# Patient Record
Sex: Male | Born: 1935 | Race: White | Hispanic: No | Smoking: Never smoker
Health system: Southern US, Community
[De-identification: ages and names within clinical notes are randomized; demographics above are authoritative.]

## PROBLEM LIST (undated history)

## (undated) HISTORY — PX: TONSILLECTOMY: SUR1361

## (undated) HISTORY — PX: BACK SURGERY: SHX140

---

## 2013-12-21 ENCOUNTER — Encounter (HOSPITAL_BASED_OUTPATIENT_CLINIC_OR_DEPARTMENT_OTHER): Payer: Self-pay | Admitting: Emergency Medicine

## 2013-12-21 ENCOUNTER — Emergency Department (HOSPITAL_BASED_OUTPATIENT_CLINIC_OR_DEPARTMENT_OTHER)
Admission: EM | Admit: 2013-12-21 | Discharge: 2013-12-22 | Disposition: A | Discharge status: Home / Self Care | Payer: MEDICARE | Attending: Emergency Medicine | Admitting: Emergency Medicine

## 2013-12-21 DIAGNOSIS — Z9889 Other specified postprocedural states: Secondary | ICD-10-CM | POA: Diagnosis not present

## 2013-12-21 DIAGNOSIS — M542 Cervicalgia: Secondary | ICD-10-CM | POA: Insufficient documentation

## 2013-12-21 DIAGNOSIS — M5412 Radiculopathy, cervical region: Secondary | ICD-10-CM | POA: Diagnosis not present

## 2013-12-21 NOTE — ED Notes (Signed)
Neck pain x 4 days. Pain is behind his left ear and down his shoulder. No injury. Has an appointment with his MD tomorrow.

## 2013-12-22 ENCOUNTER — Emergency Department (HOSPITAL_BASED_OUTPATIENT_CLINIC_OR_DEPARTMENT_OTHER): Payer: MEDICARE

## 2013-12-22 MED ORDER — FENTANYL CITRATE 0.05 MG/ML IJ SOLN
50.0000 ug | Freq: Once | INTRAMUSCULAR | Status: AC
  Administered 2013-12-22: 50 ug via NASAL
  Filled 2013-12-22: qty 2

## 2013-12-22 MED ORDER — HYDROCODONE-ACETAMINOPHEN 5-325 MG PO TABS
ORAL_TABLET | ORAL | Status: AC
  Filled 2013-12-22: qty 1

## 2013-12-22 MED ORDER — HYDROCODONE-ACETAMINOPHEN 5-325 MG PO TABS: 0.5000 | ORAL_TABLET | ORAL | Status: AC | PRN

## 2013-12-22 MED ORDER — HYDROCODONE-ACETAMINOPHEN 5-325 MG PO TABS
1.0000 | ORAL_TABLET | Freq: Once | ORAL | Status: AC
Start: 2013-12-22 — End: 2013-12-22
  Administered 2013-12-22: 1 via ORAL

## 2013-12-22 NOTE — Discharge Instructions (Signed)

## 2013-12-22 NOTE — ED Provider Notes (Addendum)
CSN: [id number]     Arrival date & time 12/21/13  2251 History   First MD Initiated Contact with Patient 12/22/13 0159     Chief Complaint  Patient presents with  . Neck Pain     (Consider location/radiation/quality/duration/timing/severity/associated sxs/prior Treatment) HPI [name] with a two-day history of pain in his neck. The pain is located in his left posterior lateral neck. He describes the pain as sharp and intense (9/10). Two days ago it radiated to his left shoulder but this subsequently radiated to his left occiput instead. It is worse with flexing his head posteriorly and improved by flexing his head anteriorly. There is also some exacerbation with rotating his head to the left but not the right. He denies any associated numbness or weakness. He denies injury. He was given fentanyl 50 mcg intranasally prior to my evaluation with significant improvement (down to 2/10). He states the pain is still comes and goes.  History reviewed. No pertinent past medical history. Past Surgical History  Procedure Laterality Date  . Tonsillectomy    . Back surgery     No family history on file. History  Substance Use Topics  . Smoking status: Never Smoker   . Smokeless tobacco: Not on file  . Alcohol Use: No    Review of Systems  All other systems reviewed and are negative.   Allergies  Benadryl  Home Medications   Prior to Admission medications   Not on File   BP 139/65  Pulse 51  Temp(Src) 98.2 F (36.8 C) (Oral)  Resp 16  Ht  (1.702 m)  Wt 150 lb (68.04 kg)  BMI 23.49 kg/m2  SpO2 99%  Physical Exam General: Well-developed, well-nourished male in no acute distress; appearance consistent with age of record HENT: normocephalic; atraumatic Eyes: pupils equal, round and reactive to light; extraocular muscles intact Neck: supple; nontender; pain worse with flexion of the head posteriorly and improved with flexion of the neck anteriorly; pain  slightly worse on rotation of the left and slightly improved on rotation to the right Heart: regular rate and rhythm Lungs: clear to auscultation bilaterally Abdomen: soft; nondistended; nontender Extremities: No deformity; full range of motion; pulses normal Neurologic: Awake, alert and oriented; motor function intact in all extremities and symmetric; sensation intact in all extremities and symmetric; no facial droop; hard of hearing Skin: Warm and dry Psychiatric: Normal mood and affect    ED Course  Procedures (including critical care time)  MDM  We will treat him symptomatically. He has an appointment with his PCP later today to address this complaint. He was advised an MRI will likely be necessary is pain persists.   Hanley Seamen, MD 12/22/13 4098  Hanley Seamen, MD 12/22/13 1191

## 2013-12-22 NOTE — ED Notes (Signed)
MD at bedside. 

## 2013-12-22 NOTE — ED Notes (Signed)
Patient transported to CT 

## 2015-02-09 IMAGING — CT CT CERVICAL SPINE W/O CM
3 of 4 series · 14 of 33 positions shown, 17 images · non-contrast
Comparison: None.

CLINICAL DATA: Neck pain for 4 days. Pain behind the left ear in
down the shoulder

EXAM:
CT CERVICAL SPINE WITHOUT CONTRAST
TECHNIQUE: Multidetector CT imaging of the cervical spine was performed without
intravenous contrast. Multiplanar CT image reconstructions were also
generated.

[Series 6: c_spine 2.0 coronal · coronal · 0.27mm/px · 3 of 54 slices shown]
[im 11/54  bone]
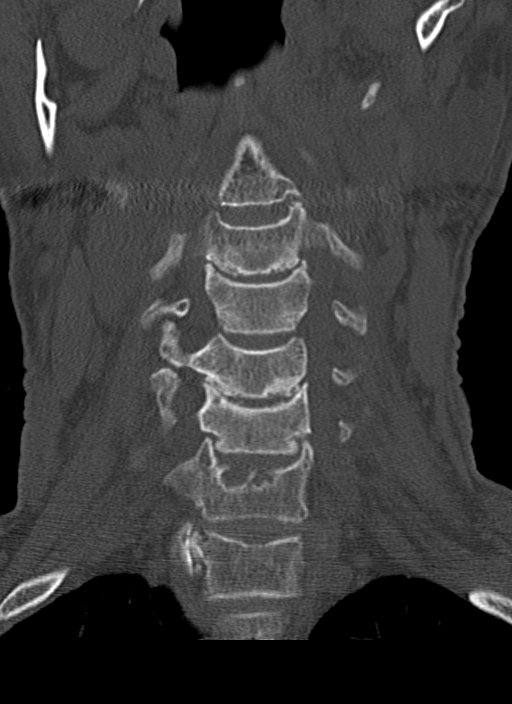
[im 22/54  bone]
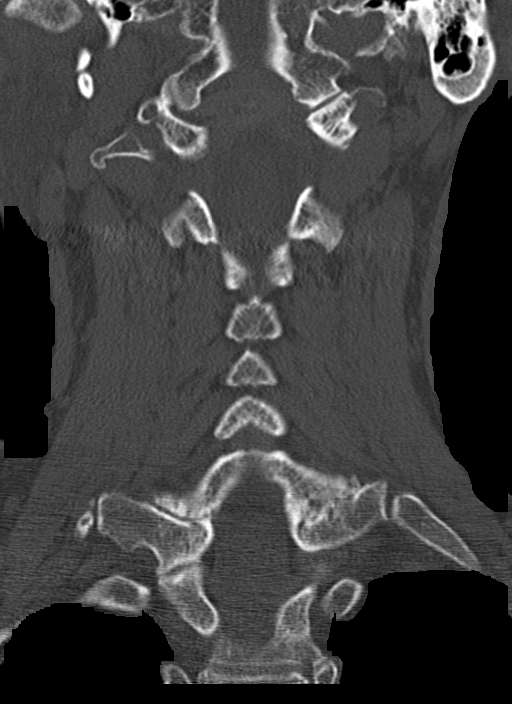
[im 32/54  bone]
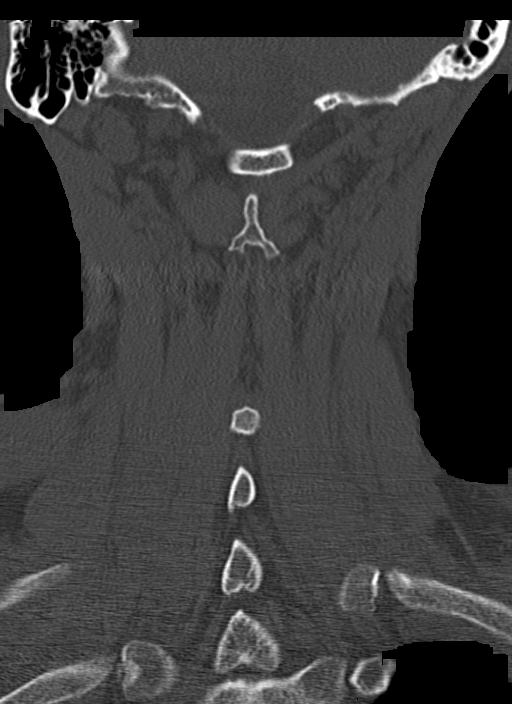

[Series 7: c_spine 2.0 sagittal · sagittal · 0.32mm/px · 5 of 51 slices shown, 6 images]
[im 17/51  bone]
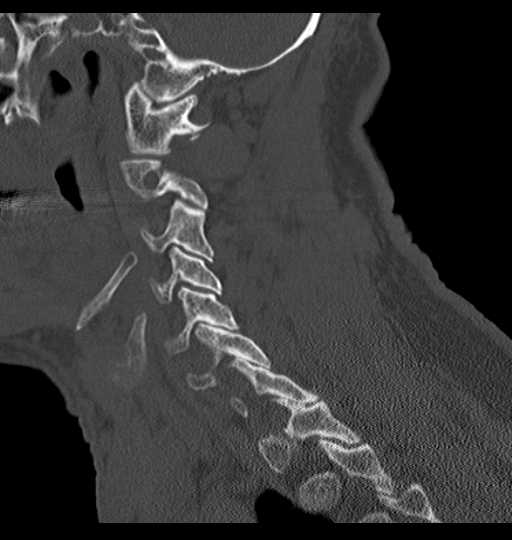
[im 21/51  bone]
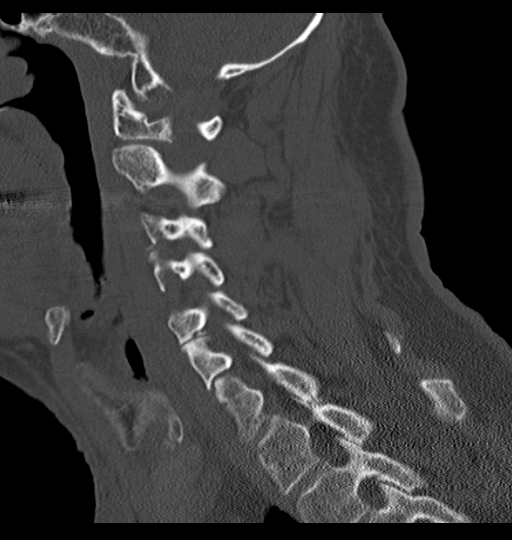
[im 26/51  soft-tissue]
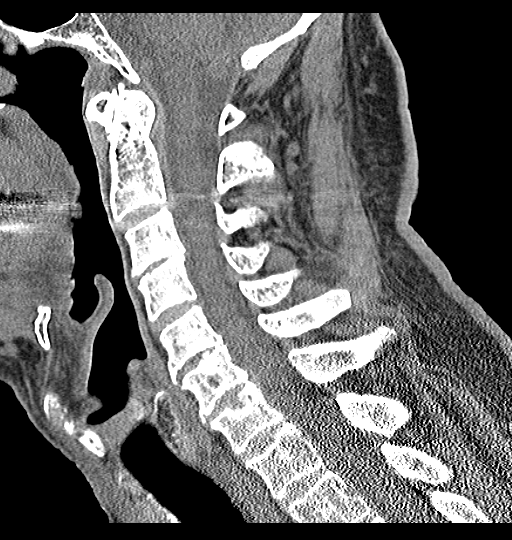
[im 26/51  bone]
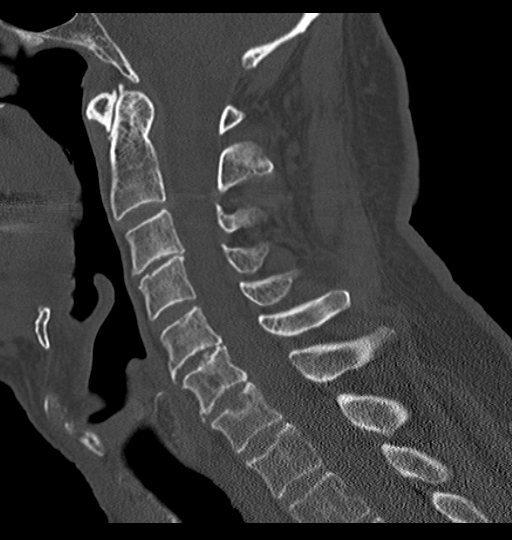
[im 30/51  bone]
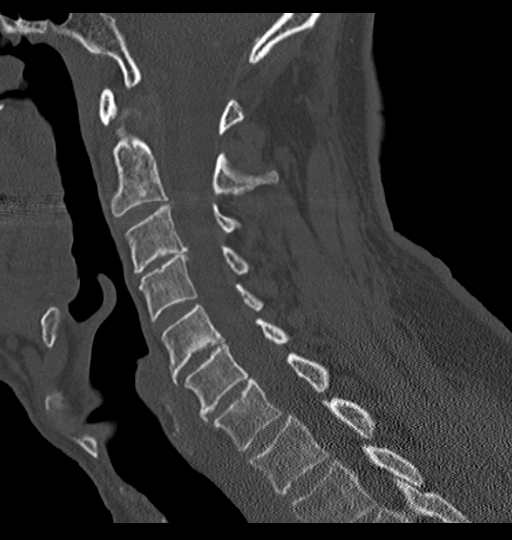
[im 34/51  bone]
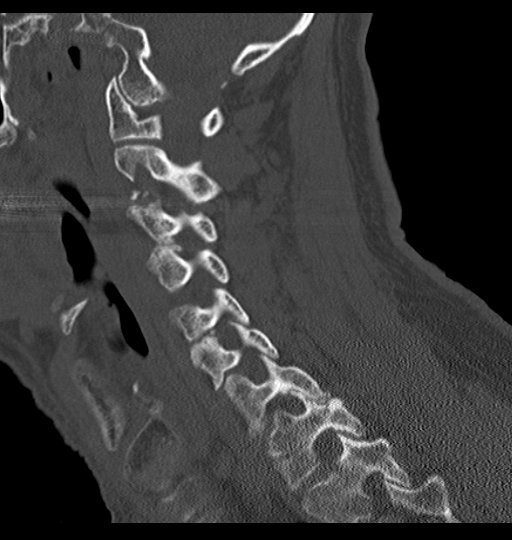

[Series 8: c_spine 2.0 orth ax · axial · 0.23mm/px · z∈[-351,-231]mm · 6 of 97 slices shown, 8 images]
[im 14/97  soft-tissue]
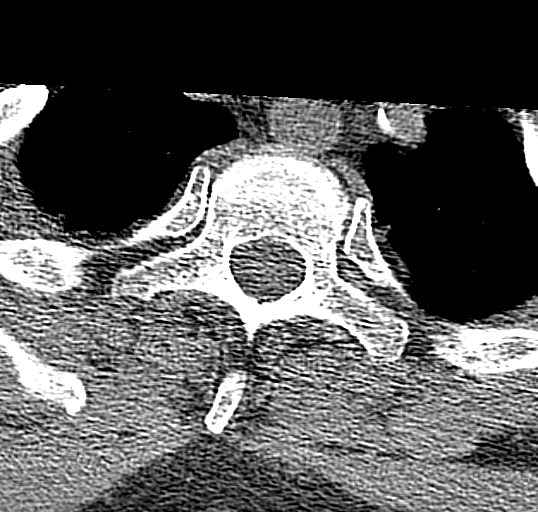
[im 14/97  bone]
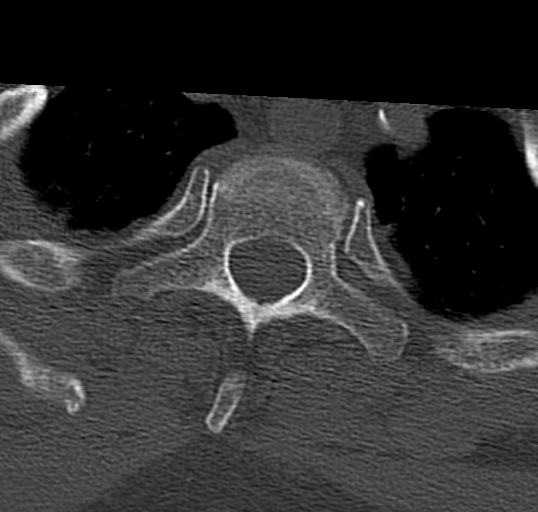
[im 28/97  bone]
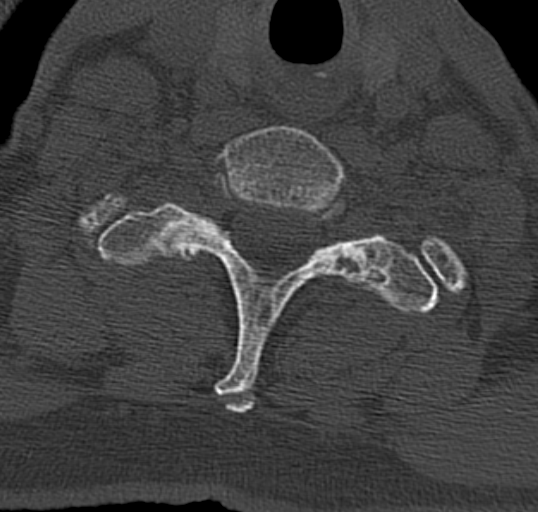
[im 42/97  bone]
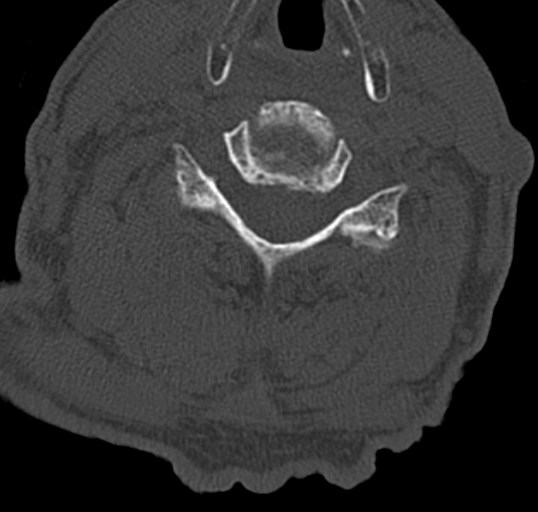
[im 55/97  bone]
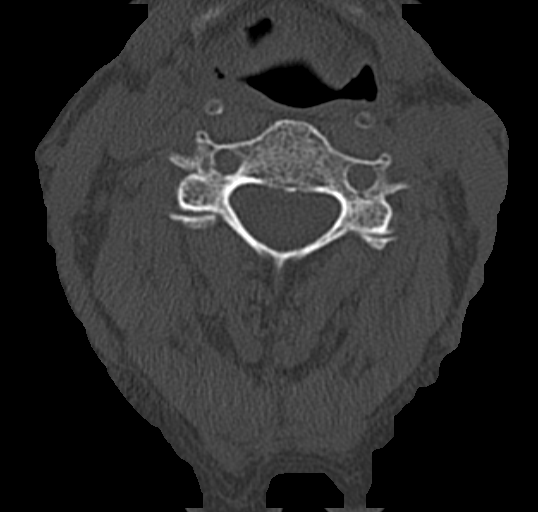
[im 69/97  soft-tissue]
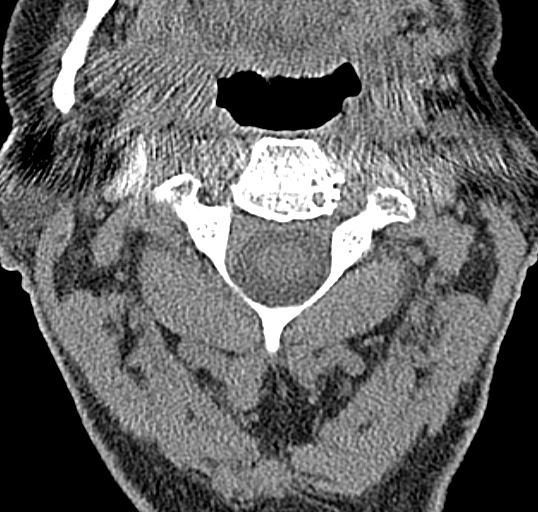
[im 69/97  bone]
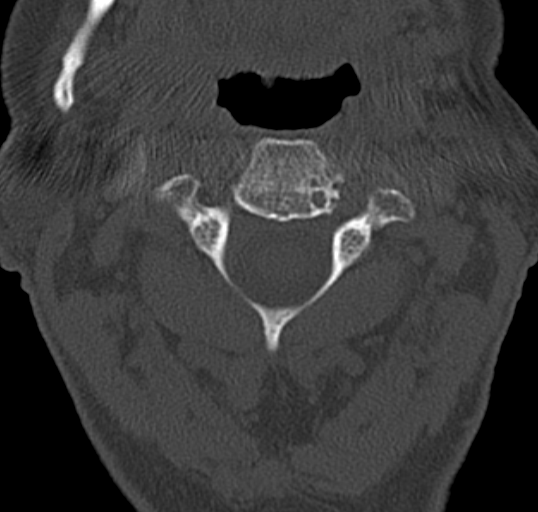
[im 83/97  bone]
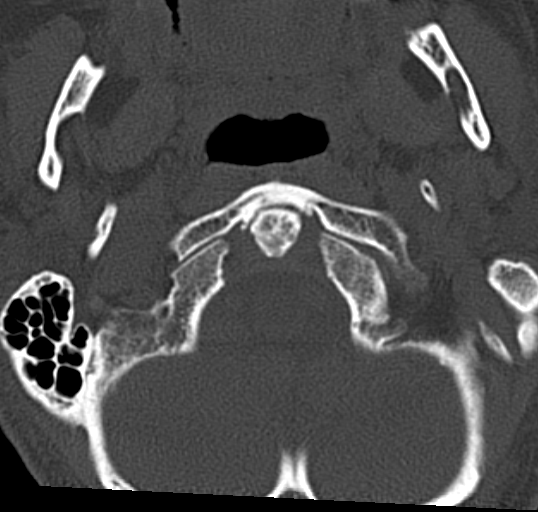

[14 of 33 positions shown; findings below may reference images not displayed]

FINDINGS: Normal alignment of the cervical spine. No vertebral compression
deformities. C1-2 articulation appears intact. Diffuse degenerative
changes throughout the cervical spine with narrowed cervical
interspaces and associated endplate hypertrophic changes. Posterior
disc osteophyte complexes at C3-4 and C5-6 levels. Degenerative
changes throughout the cervical facet joints. Schmorl's nodes at C6
and C7. No focal bone lesion or bone destruction. Bone cortex and
trabecular architecture or intact other than previously described
degenerative changes and Schmorl's nodes. Soft tissues are
unremarkable.
IMPRESSION: Normal alignment of the cervical spine. Diffuse degenerative
changes. No displaced fractures identified.

## 2017-08-11 DEATH — deceased
